# Patient Record
Sex: Female | Born: 1995 | Hispanic: No | Marital: Single | Smoking: Never smoker
Health system: Southern US, Community
[De-identification: ages and names within clinical notes are randomized; demographics above are authoritative.]

---

## 2016-05-06 ENCOUNTER — Emergency Department: Payer: 59

## 2016-05-06 ENCOUNTER — Emergency Department
Admission: EM | Admit: 2016-05-06 | Discharge: 2016-05-06 | Disposition: A | Discharge status: Home / Self Care | Payer: 59 | Attending: Emergency Medicine | Admitting: Emergency Medicine

## 2016-05-06 ENCOUNTER — Encounter: Payer: Self-pay | Admitting: Urgent Care

## 2016-05-06 DIAGNOSIS — R51 Headache: Secondary | ICD-10-CM | POA: Insufficient documentation

## 2016-05-06 DIAGNOSIS — Y999 Unspecified external cause status: Secondary | ICD-10-CM | POA: Diagnosis not present

## 2016-05-06 DIAGNOSIS — Y92410 Unspecified street and highway as the place of occurrence of the external cause: Secondary | ICD-10-CM | POA: Diagnosis not present

## 2016-05-06 DIAGNOSIS — Y9389 Activity, other specified: Secondary | ICD-10-CM | POA: Diagnosis not present

## 2016-05-06 DIAGNOSIS — S60511A Abrasion of right hand, initial encounter: Secondary | ICD-10-CM | POA: Diagnosis not present

## 2016-05-06 DIAGNOSIS — M25511 Pain in right shoulder: Secondary | ICD-10-CM | POA: Insufficient documentation

## 2016-05-06 DIAGNOSIS — S60512A Abrasion of left hand, initial encounter: Secondary | ICD-10-CM | POA: Insufficient documentation

## 2016-05-06 DIAGNOSIS — S4991XA Unspecified injury of right shoulder and upper arm, initial encounter: Secondary | ICD-10-CM | POA: Diagnosis present

## 2016-05-06 LAB — COMPREHENSIVE METABOLIC PANEL
ALT: 15 U/L (ref 14–54)
ANION GAP: 8 (ref 5–15)
AST: 26 U/L (ref 15–41)
Albumin: 4.4 g/dL (ref 3.5–5.0)
Alkaline Phosphatase: 72 U/L (ref 38–126)
BUN: 12 mg/dL (ref 6–20)
CALCIUM: 9.3 mg/dL (ref 8.9–10.3)
CHLORIDE: 108 mmol/L (ref 101–111)
CO2: 23 mmol/L (ref 22–32)
Creatinine, Ser: 0.71 mg/dL (ref 0.44–1.00)
Glucose, Bld: 104 mg/dL — ABNORMAL HIGH (ref 65–99)
Potassium: 3.8 mmol/L (ref 3.5–5.1)
SODIUM: 139 mmol/L (ref 135–145)
Total Bilirubin: 1.8 mg/dL — ABNORMAL HIGH (ref 0.3–1.2)
Total Protein: 7.6 g/dL (ref 6.5–8.1)

## 2016-05-06 LAB — CBC WITH DIFFERENTIAL/PLATELET
Basophils Absolute: 0 10*3/uL (ref 0–0.1)
Basophils Relative: 1 %
EOS ABS: 0.1 10*3/uL (ref 0–0.7)
EOS PCT: 2 %
HCT: 42 % (ref 35.0–47.0)
Hemoglobin: 14.2 g/dL (ref 12.0–16.0)
LYMPHS ABS: 1.7 10*3/uL (ref 1.0–3.6)
Lymphocytes Relative: 18 %
MCH: 31 pg (ref 26.0–34.0)
MCHC: 33.9 g/dL (ref 32.0–36.0)
MCV: 91.6 fL (ref 80.0–100.0)
MONO ABS: 0.8 10*3/uL (ref 0.2–0.9)
MONOS PCT: 8 %
Neutro Abs: 6.8 10*3/uL — ABNORMAL HIGH (ref 1.4–6.5)
Neutrophils Relative %: 71 %
PLATELETS: 192 10*3/uL (ref 150–440)
RBC: 4.58 MIL/uL (ref 3.80–5.20)
RDW: 13.2 % (ref 11.5–14.5)
WBC: 9.4 10*3/uL (ref 3.6–11.0)

## 2016-05-06 LAB — POCT PREGNANCY, URINE: PREG TEST UR: NEGATIVE

## 2016-05-06 MED ORDER — TRAMADOL HCL 50 MG PO TABS: 50.0000 mg | ORAL_TABLET | Freq: Four times a day (QID) | ORAL | 0 refills | Status: AC | PRN

## 2016-05-06 MED ORDER — MORPHINE SULFATE (PF) 4 MG/ML IV SOLN
4.0000 mg | Freq: Once | INTRAVENOUS | Status: AC
  Administered 2016-05-06: 4 mg via INTRAVENOUS
  Filled 2016-05-06: qty 1

## 2016-05-06 MED ORDER — ONDANSETRON HCL 4 MG/2ML IJ SOLN
4.0000 mg | INTRAMUSCULAR | Status: AC
  Administered 2016-05-06: 4 mg via INTRAVENOUS
  Filled 2016-05-06: qty 2
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
## 2016-05-06 NOTE — ED Notes (Signed)
C-Collar placed on patient.

## 2016-05-06 NOTE — ED Notes (Signed)
C-collar removed per MD okay. X-ray notified that C-collar removed and states they will come get her for X-ray. Pt continues to rest in bed with NAD noted. Pt parent's at bedside at this time. Will continue to monitor for further patient needs.

## 2016-05-06 NOTE — ED Notes (Signed)
This RN assisted patient to the bathroom to obtain a urine sample. PT tolerated well. Pt then assisted back to the bed by this RN. Pt states pain 3/10 at this time. Will continue to monitor for further patient needs.

## 2016-05-06 NOTE — ED Notes (Signed)
NAD noted at time of D/C. Pt denies questions or concerns. Pt ambulatory to the lobby at this time. Pt refused wheelchair to the lobby. Pt ambulatory with her parents.

## 2016-05-06 NOTE — ED Notes (Signed)
Pt returned from X-ray at this time. Pt's parents remain at bedside at this time. Will continue to monitor for further patient needs.
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
## 2016-05-06 NOTE — ED Notes (Signed)
Pt taken for CT at this time

## 2016-05-06 NOTE — ED Notes (Signed)
This RN offered multiple times for patient to go out in a wheelchair, pt and her mother refused at this time.

## 2016-05-06 NOTE — Discharge Instructions (Signed)
Please seek medical attention for any high fevers, chest pain, shortness of breath, change in behavior, persistent vomiting, bloody stool or any other new or concerning symptoms.  
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
